# Patient Record
Sex: Male | Born: 1980 | Race: White | Hispanic: No | Marital: Married
Health system: Southern US, Community
[De-identification: ages and names within clinical notes are randomized; demographics above are authoritative.]

---

## 2009-01-15 ENCOUNTER — Ambulatory Visit: Payer: Self-pay | Admitting: Internal Medicine

## 2015-07-24 ENCOUNTER — Other Ambulatory Visit: Payer: Self-pay | Admitting: Unknown Physician Specialty

## 2015-07-24 ENCOUNTER — Ambulatory Visit
Admission: RE | Admit: 2015-07-24 | Discharge: 2015-07-24 | Disposition: A | Discharge status: Home / Self Care | Payer: Managed Care, Other (non HMO) | Source: Ambulatory Visit | Attending: Unknown Physician Specialty | Admitting: Unknown Physician Specialty

## 2015-07-24 DIAGNOSIS — R109 Unspecified abdominal pain: Secondary | ICD-10-CM

## 2015-07-24 DIAGNOSIS — R1033 Periumbilical pain: Secondary | ICD-10-CM | POA: Diagnosis present

## 2015-08-21 ENCOUNTER — Other Ambulatory Visit: Payer: Self-pay | Admitting: Family Medicine

## 2015-08-21 DIAGNOSIS — R2689 Other abnormalities of gait and mobility: Secondary | ICD-10-CM

## 2015-08-21 DIAGNOSIS — R413 Other amnesia: Secondary | ICD-10-CM

## 2015-08-26 ENCOUNTER — Ambulatory Visit
Admission: RE | Admit: 2015-08-26 | Discharge: 2015-08-26 | Disposition: A | Discharge status: Home / Self Care | Payer: Managed Care, Other (non HMO) | Source: Ambulatory Visit | Attending: Family Medicine | Admitting: Family Medicine

## 2015-11-25 ENCOUNTER — Other Ambulatory Visit: Payer: Self-pay | Admitting: Internal Medicine

## 2015-11-25 DIAGNOSIS — E291 Testicular hypofunction: Secondary | ICD-10-CM

## 2015-11-25 DIAGNOSIS — E230 Hypopituitarism: Secondary | ICD-10-CM

## 2015-11-28 ENCOUNTER — Ambulatory Visit
Admission: RE | Admit: 2015-11-28 | Discharge: 2015-11-28 | Disposition: A | Discharge status: Home / Self Care | Payer: Managed Care, Other (non HMO) | Source: Ambulatory Visit | Attending: Internal Medicine | Admitting: Internal Medicine

## 2015-11-28 DIAGNOSIS — E230 Hypopituitarism: Secondary | ICD-10-CM | POA: Insufficient documentation

## 2015-11-28 DIAGNOSIS — E291 Testicular hypofunction: Secondary | ICD-10-CM | POA: Insufficient documentation

## 2015-11-28 DIAGNOSIS — R9089 Other abnormal findings on diagnostic imaging of central nervous system: Secondary | ICD-10-CM | POA: Diagnosis not present

## 2015-11-28 MED ORDER — GADOBENATE DIMEGLUMINE 529 MG/ML IV SOLN
10.0000 mL | Freq: Once | INTRAVENOUS | Status: AC | PRN
  Administered 2015-11-28: 10 mL via INTRAVENOUS

## 2016-04-16 ENCOUNTER — Other Ambulatory Visit: Payer: Self-pay | Admitting: Internal Medicine

## 2016-04-16 DIAGNOSIS — D352 Benign neoplasm of pituitary gland: Secondary | ICD-10-CM

## 2016-04-28 ENCOUNTER — Ambulatory Visit: Payer: Managed Care, Other (non HMO)

## 2016-06-02 ENCOUNTER — Ambulatory Visit
Admission: RE | Admit: 2016-06-02 | Discharge: 2016-06-02 | Disposition: A | Discharge status: Home / Self Care | Payer: Managed Care, Other (non HMO) | Source: Ambulatory Visit | Attending: Internal Medicine | Admitting: Internal Medicine

## 2016-06-02 DIAGNOSIS — D352 Benign neoplasm of pituitary gland: Secondary | ICD-10-CM | POA: Diagnosis present

## 2016-06-02 DIAGNOSIS — J331 Polypoid sinus degeneration: Secondary | ICD-10-CM | POA: Diagnosis not present

## 2016-06-02 MED ORDER — GADOBENATE DIMEGLUMINE 529 MG/ML IV SOLN
10.0000 mL | Freq: Once | INTRAVENOUS | Status: AC | PRN
  Administered 2016-06-02: 10 mL via INTRAVENOUS

## 2016-09-21 DIAGNOSIS — D352 Benign neoplasm of pituitary gland: Secondary | ICD-10-CM

## 2016-09-21 HISTORY — DX: Benign neoplasm of pituitary gland: D35.2

## 2017-08-03 IMAGING — MR MR HEAD WO/W CM
10 of 18 series · 35 of 48 positions shown · IV contrast (multihance)
Comparison: 11/28/2015 brain MR.

CLINICAL DATA: 35-year-old male with history of hypogonadism.
Patient seeing spots in right eye over the past week. Subsequent
encounter.

EXAM:
MRI HEAD WITHOUT AND WITH CONTRAST
TECHNIQUE: Multiplanar, multiecho pulse sequences of the brain and surrounding
structures were obtained without and with intravenous contrast.
CONTRAST:  10mL MULTIHANCE GADOBENATE DIMEGLUMINE 529 MG/ML IV SOLN

[Series 2: T1 · sagittal · 5.0mm · 0.62mm/px · 1 of 31 slices shown]
[im 1/31]
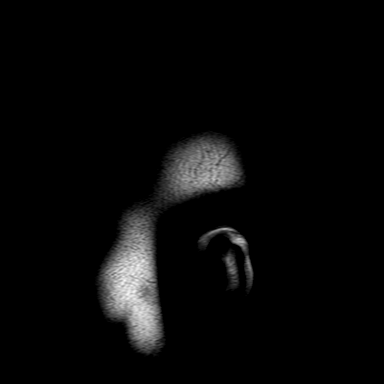

[Series 4: DWI · axial · 4.0mm · 0.94mm/px · z∈[-72,+102]mm · 5 of 45 slices shown (1 of 2)]
[im 1/45]
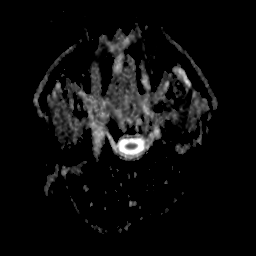
[im 12/45]
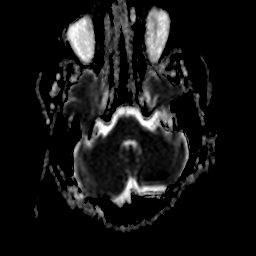
[im 23/45]
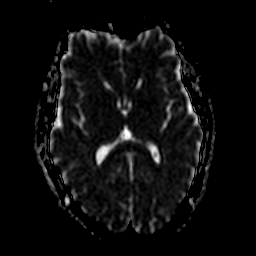
[im 34/45]
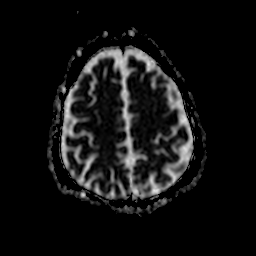
[im 45/45]
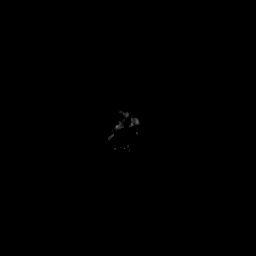

[Series 5: T2 · axial · 5.0mm · 0.57mm/px · z∈[-78,+102]mm · 3 of 27 slices shown (1 of 2)]
[im 1/27]
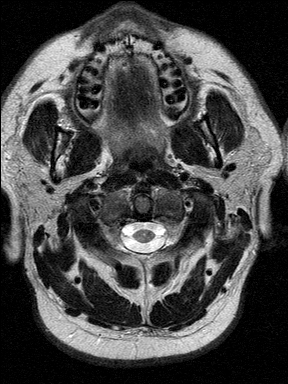
[im 14/27]
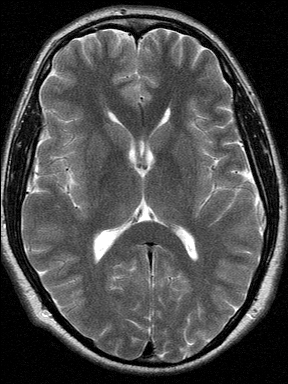
[im 27/27]
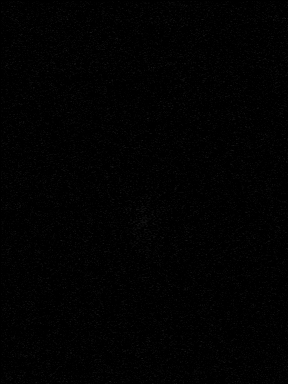

[Series 6: T2 · axial · 5.0mm · 0.57mm/px · z∈[-76,+104]mm · 3 of 27 slices shown (2 of 2)]
[im 1/27]
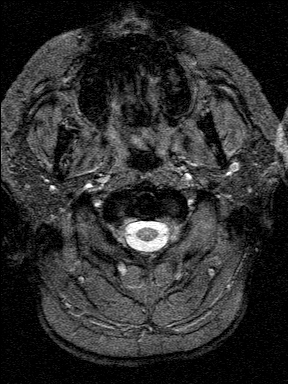
[im 14/27]
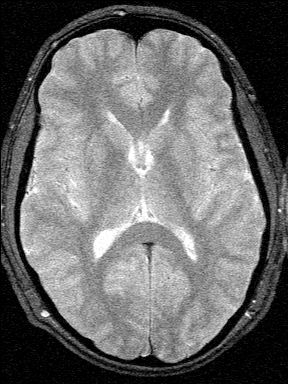
[im 27/27]
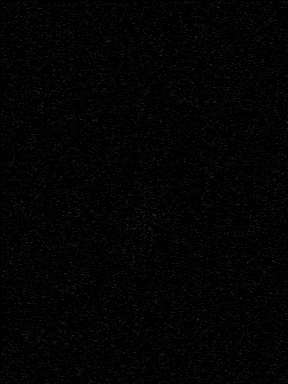

[Series 7: DWI · axial · 4.0mm · 0.94mm/px · z∈[-72,+98]mm · 5 of 44 slices shown (2 of 2)]
[im 1/44]
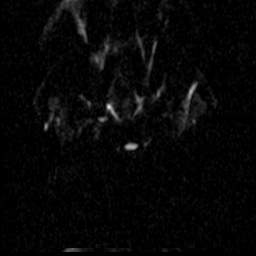
[im 11/44]
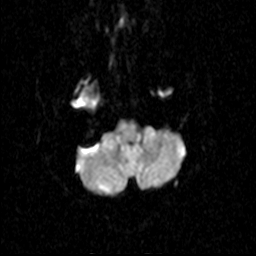
[im 22/44]
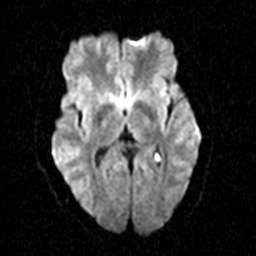
[im 33/44]
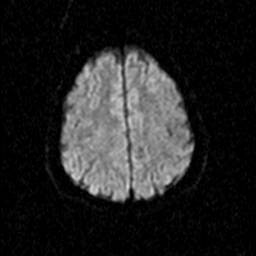
[im 44/44]
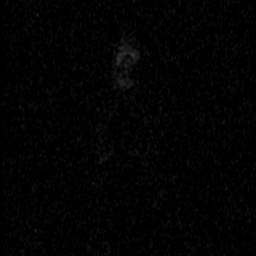

[Series 8: FLAIR · axial · 5.0mm · 0.57mm/px · z∈[-71,+94]mm · 3 of 25 slices shown]
[im 1/25]
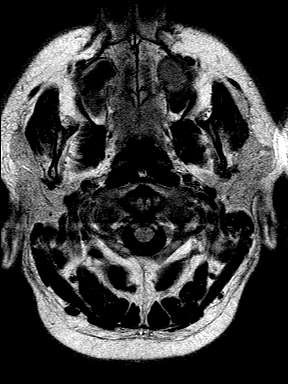
[im 13/25]
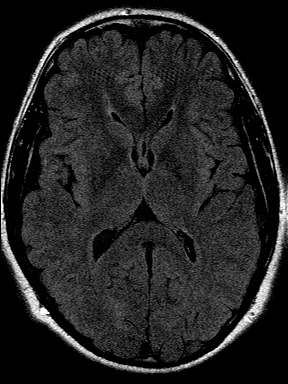
[im 25/25]
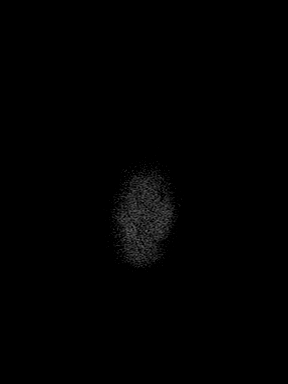

[Series 22: T1 post-contrast · sagittal · 3.0mm · 0.42mm/px · 2 of 15 slices shown (1 of 4)]
[im 1/15]
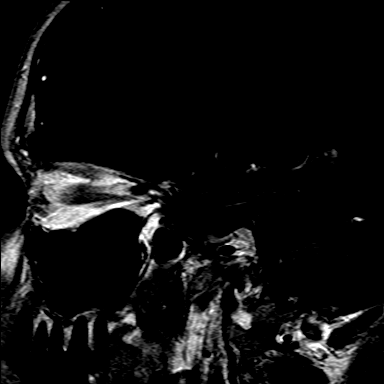
[im 15/15]
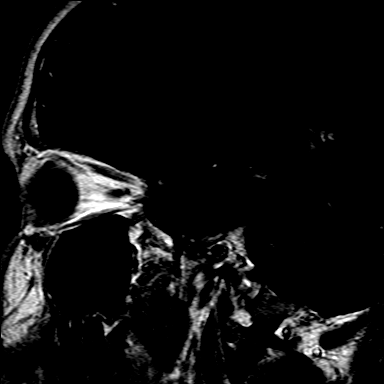

[Series 23: T1 post-contrast · coronal · 3.0mm · 0.42mm/px · 2 of 15 slices shown (2 of 4)]
[im 1/15]
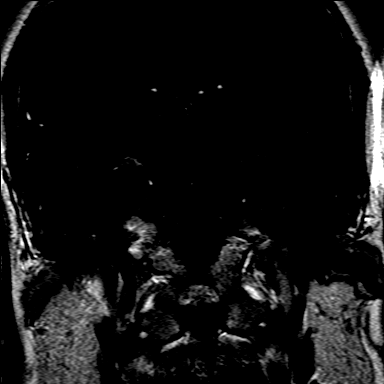
[im 15/15]
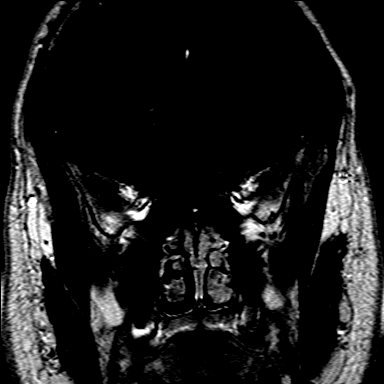

[Series 24: T1 post-contrast · axial · 3.0mm · 0.43mm/px · z∈[-72,+103]mm · 7 of 60 slices shown (3 of 4)]
[im 1/60]
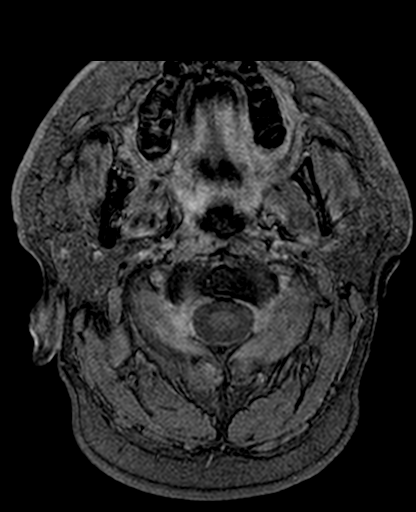
[im 10/60]
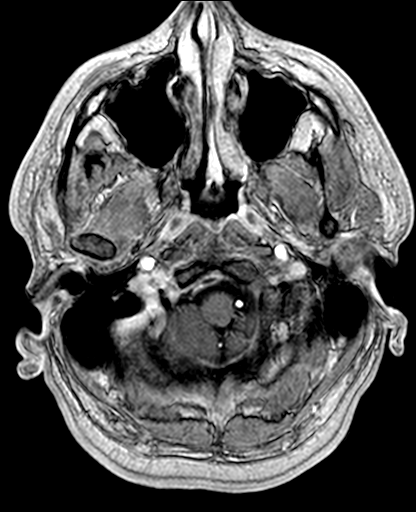
[im 20/60]
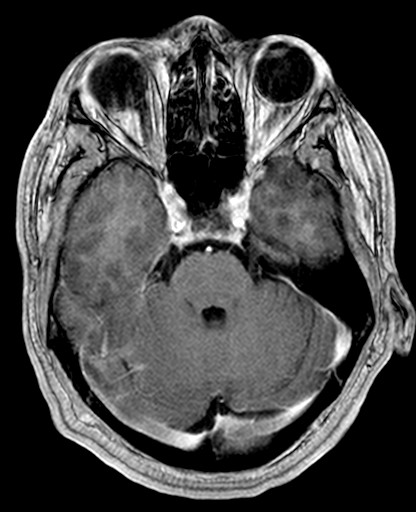
[im 30/60]
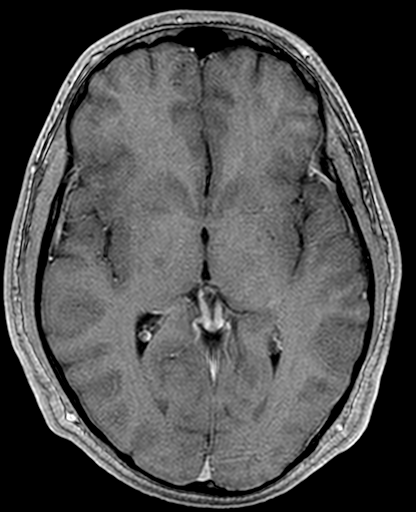
[im 40/60]
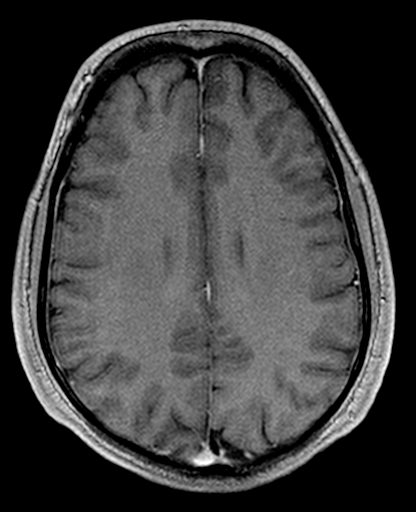
[im 50/60]
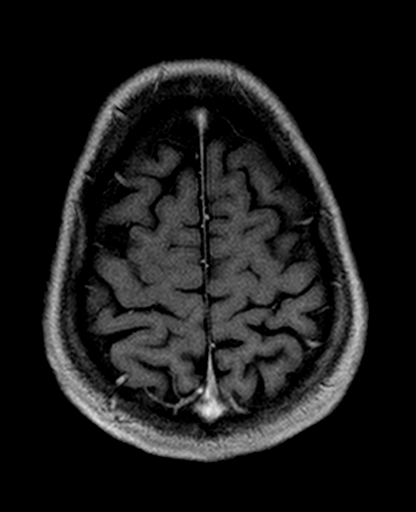
[im 60/60]
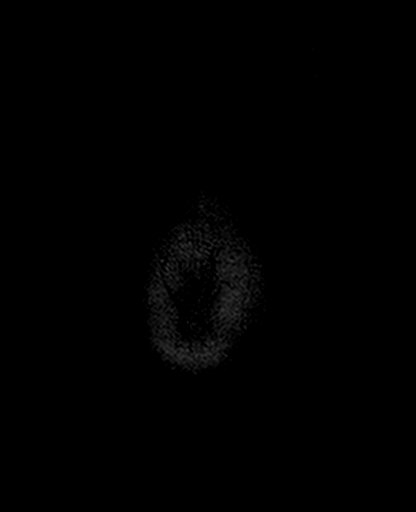

[Series 25: T1 post-contrast · coronal · 5.0mm · 0.43mm/px · 4 of 32 slices shown (4 of 4)]
[im 1/32]
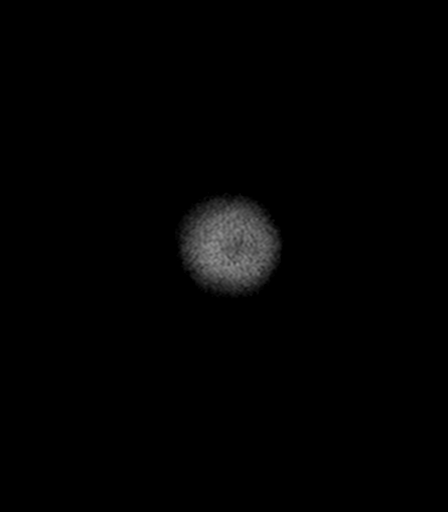
[im 11/32]
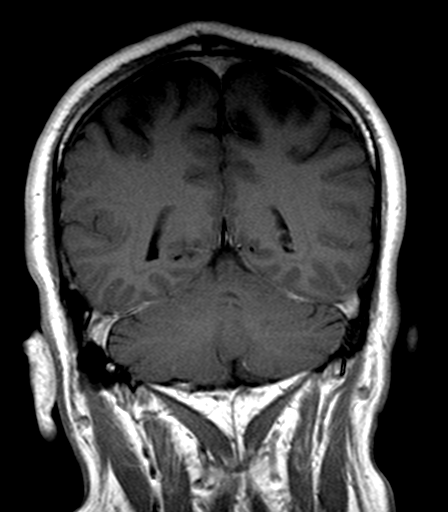
[im 21/32]
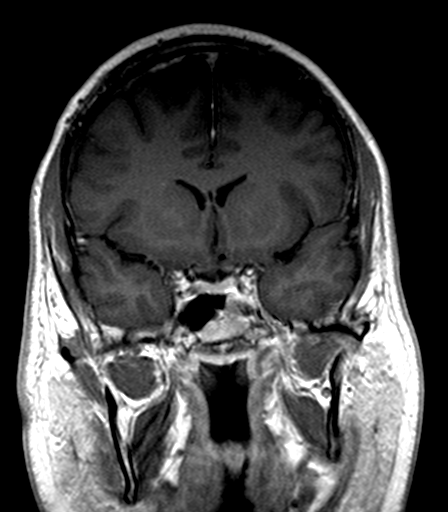
[im 32/32]
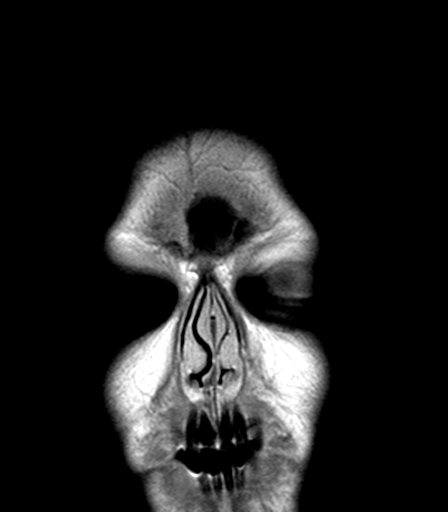

[35 of 48 positions shown; findings below may reference images not displayed]

FINDINGS: 3.8 x 1.3 mm area of decreased enhancement within the pituitary
gland is unchanged. This is located at the expected junction of the
anterior and posterior gland and may represent a small incidentally
noted pars intermedius cyst rather than a Rathke's cleft cyst or
microadenoma.

No acute infarct or intracranial hemorrhage.

No age advanced atrophy or hydrocephalus.

Major intracranial vascular structures are patent.

No acute orbital abnormality.

1.1 cm polypoid opacification inferior left maxillary sinus. Minimal
mucosal thickening maxillary sinuses and ethmoid sinus air cells
bilaterally.
IMPRESSION: 3.8 x 1.3 mm area of decreased enhancement within the pituitary
gland is unchanged. This is located at the expected junction of the
anterior and posterior gland and may represent a small incidentally
noted pars intermedius cyst rather than a Rathke's cleft cyst or
microadenoma.

1.1 cm polypoid opacification inferior left maxillary sinus. Minimal
mucosal thickening maxillary sinuses and ethmoid sinus air cells
bilaterally.

## 2019-05-24 ENCOUNTER — Other Ambulatory Visit: Payer: Self-pay

## 2019-05-24 DIAGNOSIS — Z20822 Contact with and (suspected) exposure to covid-19: Secondary | ICD-10-CM

## 2019-05-25 LAB — NOVEL CORONAVIRUS, NAA: SARS-CoV-2, NAA: NOT DETECTED

## 2020-02-14 ENCOUNTER — Ambulatory Visit (INDEPENDENT_AMBULATORY_CARE_PROVIDER_SITE_OTHER): Payer: BC Managed Care – PPO | Admitting: Dermatology

## 2020-02-14 ENCOUNTER — Other Ambulatory Visit: Payer: Self-pay

## 2020-02-14 ENCOUNTER — Encounter: Payer: Self-pay | Admitting: Dermatology

## 2020-02-14 DIAGNOSIS — R234 Changes in skin texture: Secondary | ICD-10-CM | POA: Diagnosis not present

## 2020-02-14 DIAGNOSIS — L2081 Atopic neurodermatitis: Secondary | ICD-10-CM | POA: Diagnosis not present

## 2020-02-14 DIAGNOSIS — L2089 Other atopic dermatitis: Secondary | ICD-10-CM | POA: Diagnosis not present

## 2020-02-14 DIAGNOSIS — L011 Impetiginization of other dermatoses: Secondary | ICD-10-CM | POA: Diagnosis not present

## 2020-02-14 MED ORDER — TRIAMCINOLONE ACETONIDE 0.1 % EX CREA: 1.0000 "application " | TOPICAL_CREAM | Freq: Two times a day (BID) | CUTANEOUS | 1 refills | Status: AC | PRN

## 2020-02-14 MED ORDER — DOXYCYCLINE HYCLATE 100 MG PO CAPS: 100.0000 mg | ORAL_CAPSULE | Freq: Two times a day (BID) | ORAL | 0 refills | Status: AC

## 2020-02-14 NOTE — Patient Instructions (Signed)
Call office in 3-4 weeks to let Dr. Nehemiah Massed how you are doing. If not improving, will plan to send in Eucrisa at that point instead of waiting until follow up appointment.

## 2020-02-14 NOTE — Progress Notes (Signed)
   New Patient Visit  Subjective  Michael Griffith is a 39 y.o. male who presents for the following: Rash (of bilateral arms. Has dry skin and gets open sores. This has been going on since he was a teenager. He has seen a dermatologist in the past. It was on his legs when he was younger.). The condition does itch and he scratches a lot.   The following portions of the chart were reviewed this encounter and updated as appropriate:  Allergies  Meds  Problems  Med Hx  Surg Hx  Fam Hx      Review of Systems:  No other skin or systemic complaints except as noted in HPI or Assessment and Plan.  Objective  Well appearing patient in no apparent distress; mood and affect are within normal limits.  All skin waist up examined.  Objective  Right Antecubital Fossa: Pinkness, scale, crusts, erosions of arm and shoulders.  Pink macular eruption of abdomen.  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  May consider Dupixent in the future.  doxycycline (VIBRAMYCIN) 100 MG capsule - Right Antecubital Fossa  triamcinolone cream (KENALOG) 0.1 % - Right Antecubital Fossa  Return in about 8 weeks (around 04/10/2020).  I, Ashok Cordia, CMA, am acting as scribe for Sarina Ser, MD . Documentation: I have reviewed the above documentation for accuracy and completeness, and I agree with the above.  Sarina Ser, MD

## 2020-02-16 ENCOUNTER — Encounter: Payer: Self-pay | Admitting: Dermatology

## 2020-04-11 ENCOUNTER — Ambulatory Visit: Payer: BC Managed Care – PPO | Admitting: Dermatology

## 2020-06-27 ENCOUNTER — Ambulatory Visit (INDEPENDENT_AMBULATORY_CARE_PROVIDER_SITE_OTHER): Payer: BC Managed Care – PPO | Admitting: Dermatology

## 2020-06-27 ENCOUNTER — Other Ambulatory Visit: Payer: Self-pay

## 2020-06-27 DIAGNOSIS — L011 Impetiginization of other dermatoses: Secondary | ICD-10-CM | POA: Diagnosis not present

## 2020-06-27 DIAGNOSIS — L2081 Atopic neurodermatitis: Secondary | ICD-10-CM

## 2020-06-27 DIAGNOSIS — L209 Atopic dermatitis, unspecified: Secondary | ICD-10-CM

## 2020-06-27 MED ORDER — DOXYCYCLINE MONOHYDRATE 100 MG PO CAPS: 100.0000 mg | ORAL_CAPSULE | Freq: Two times a day (BID) | ORAL | 0 refills | Status: AC

## 2020-06-27 MED ORDER — DUPIXENT 300 MG/2ML ~~LOC~~ SOSY: 600.0000 mg | PREFILLED_SYRINGE | Freq: Once | SUBCUTANEOUS | 0 refills | Status: AC

## 2020-06-27 MED ORDER — MUPIROCIN 2 % EX OINT: 1.0000 "application " | TOPICAL_OINTMENT | Freq: Two times a day (BID) | CUTANEOUS | 1 refills | Status: AC

## 2020-06-27 MED ORDER — DUPIXENT 300 MG/2ML ~~LOC~~ SOSY: 300.0000 mg | PREFILLED_SYRINGE | SUBCUTANEOUS | 6 refills | Status: AC

## 2020-06-27 NOTE — Progress Notes (Signed)
   Follow-Up Visit   Subjective  Michael Griffith is a 39 y.o. male who presents for the following: Atopic Derm -severe with atopic neurodermatitis (currently bil arms, > 8wk f/u, TMC 0.1% cr bid, pt said improved on Doxycycline x 1wk, but recently flared and itchy).  The following portions of the chart were reviewed this encounter and updated as appropriate:  Allergies  Meds  Problems  Med Hx  Surg Hx  Fam Hx     Review of Systems:  No other skin or systemic complaints except as noted in HPI or Assessment and Plan.  Objective  Well appearing patient in no apparent distress; mood and affect are within normal limits.  A focused examination was performed including bil arms. Relevant physical exam findings are noted in the Assessment and Plan.  Objective  bil arms: Pinkness, scale, crusts, erosions of arm and shoulders.   Assessment & Plan  Atopic dermatitis; atopic neurodermatitis-severe with crusts and excoriations and secondary impetiginization -worse today despite treatment.  BSA 12% bil arms Severe with atopic neurodermatitis With crusts and excoriations and impetiginization  Restart Doxycycline 100mg  1 po bid with food and drink for 2wks Start Mupirocin oint bid to open sores Cont TMC 0.1% cr qd up to 5d/wk to arms prn itch, avoid f/g/a  Discussed Dupixent injections, will plan to start on f/u.  Gave informational literature on Dupixent BSA 12%  Doxycycline should be taken with food to prevent nausea. Do not lay down for 30 minutes after taking. Be cautious with sun exposure and use good sun protection while on this medication. Pregnant women should not take this medication.    Dupilumab (Dupixent) is a treatment given by injection for adults with moderate-to-severe atopic dermatitis. Goal is control of skin condition, not cure. It is given as 2 injections at the first dose followed by 1 injection ever 2 weeks thereafter.  Potential side effects include allergic reaction,  herpes infections, injection site reactions and conjunctivitis (inflammation of the eyes).  The use of Dupixent requires long term medication management, including periodic office visits.   doxycycline (MONODOX) 100 MG capsule - bil arms  mupirocin ointment (BACTROBAN) 2 % - bil arms  dupilumab (DUPIXENT) 300 MG/2ML prefilled syringe - bil arms  dupilumab (DUPIXENT) 300 MG/2ML prefilled syringe - bil arms  Return in about 2 years (around 06/27/2022) for Atopic derm/start Dupixent.   I, Othelia Pulling, RMA, am acting as scribe for Sarina Ser, MD .  Documentation: I have reviewed the above documentation for accuracy and completeness, and I agree with the above.  Sarina Ser, MD

## 2020-06-28 ENCOUNTER — Encounter: Payer: Self-pay | Admitting: Dermatology

## 2020-07-11 ENCOUNTER — Other Ambulatory Visit: Payer: Self-pay

## 2020-07-11 ENCOUNTER — Ambulatory Visit (INDEPENDENT_AMBULATORY_CARE_PROVIDER_SITE_OTHER): Payer: BC Managed Care – PPO | Admitting: Dermatology

## 2020-07-11 DIAGNOSIS — L2081 Atopic neurodermatitis: Secondary | ICD-10-CM | POA: Diagnosis not present

## 2020-07-11 MED ORDER — TACROLIMUS 0.1 % EX OINT: TOPICAL_OINTMENT | Freq: Every day | CUTANEOUS | 1 refills | Status: AC

## 2020-07-11 MED ORDER — DUPILUMAB 300 MG/2ML ~~LOC~~ SOSY
300.0000 mg | PREFILLED_SYRINGE | Freq: Once | SUBCUTANEOUS | Status: AC
  Administered 2020-07-11: 300 mg via SUBCUTANEOUS

## 2020-07-11 MED ORDER — DUPILUMAB 300 MG/2ML ~~LOC~~ SOSY
300.0000 mg | PREFILLED_SYRINGE | Freq: Once | SUBCUTANEOUS | Status: AC
Start: 2020-07-11 — End: 2020-07-11
  Administered 2020-07-11: 300 mg via SUBCUTANEOUS

## 2020-07-11 MED ORDER — MOMETASONE FUROATE 0.1 % EX CREA: 1.0000 "application " | TOPICAL_CREAM | Freq: Every day | CUTANEOUS | 1 refills | Status: AC

## 2020-07-11 NOTE — Progress Notes (Signed)
   Follow-Up Visit   Subjective  Michael Griffith is a 39 y.o. male who presents for the following: Dermatitis (Atopic Derm severe, bil arms f/u, finished Doxycycline x 2wks, using mupirocin oint bid, TMC 0.1% cr qd, has had some mild itching which didnt have before).  The following portions of the chart were reviewed this encounter and updated as appropriate:  Allergies  Meds  Problems  Med Hx  Surg Hx  Fam Hx     Review of Systems:  No other skin or systemic complaints except as noted in HPI or Assessment and Plan.  Objective  Well appearing patient in no apparent distress; mood and affect are within normal limits.  A focused examination was performed including bil arms. Relevant physical exam findings are noted in the Assessment and Plan.  Objective  bil arms: Pinkness, scale, crusts, erosions of arm and shoulders  Images       Assessment & Plan  Atopic neurodermatitis bil arms Atopic dermatitis; atopic neurodermatitis-severe with crusts and excoriations and secondary impetiginization. Failed treatment with topical steroids.  Impetiginization much improved/resolving with recent Doxycycline 2wk course  BSA 12%  Start Dupixent 300mg /58ml sq injections  Today Dupixent 300mg /1ml sq injections to R and L upper arm Lot #[id number] exp 09/2021 (Dupixent loading dose 2 injections)  D/c TMC 0.1% cr Start Mometasone cr qd aa arms Start Tacrolimus 0.1% oint qd to aa arms Cont Mupirocin oint qd to any open sores  Dupilumab (Dupixent) is a treatment given by injection for adults with moderate-to-severe atopic dermatitis. Goal is control of skin condition, not cure. It is given as 2 injections at the first dose followed by 1 injection ever 2 weeks thereafter.  Potential side effects include allergic reaction, herpes infections, injection site reactions and conjunctivitis (inflammation of the eyes).  The use of Dupixent requires long term medication management, including periodic  office visits.   mometasone (ELOCON) 0.1 % cream - bil arms  tacrolimus (PROTOPIC) 0.1 % ointment - bil arms  dupilumab (DUPIXENT) prefilled syringe 300 mg - bil arms  dupilumab (DUPIXENT) prefilled syringe 300 mg - bil arms  Return in about 2 weeks (around 07/25/2020) for Atopic derm/Dupixent.   I, Othelia Pulling, RMA, am acting as scribe for Sarina Ser, MD .  Documentation: I have reviewed the above documentation for accuracy and completeness, and I agree with the above.  Sarina Ser, MD

## 2020-07-11 NOTE — Patient Instructions (Signed)
Tacrolimus ointment 0.1% apply to arms once daily Mometasone cream apply to arms once daily  Stop the Triamcinolone cream

## 2020-07-16 ENCOUNTER — Encounter: Payer: Self-pay | Admitting: Dermatology

## 2020-07-25 ENCOUNTER — Other Ambulatory Visit: Payer: Self-pay

## 2020-07-25 ENCOUNTER — Ambulatory Visit (INDEPENDENT_AMBULATORY_CARE_PROVIDER_SITE_OTHER): Payer: BC Managed Care – PPO | Admitting: Dermatology

## 2020-07-25 DIAGNOSIS — Z79899 Other long term (current) drug therapy: Secondary | ICD-10-CM | POA: Diagnosis not present

## 2020-07-25 DIAGNOSIS — L2081 Atopic neurodermatitis: Secondary | ICD-10-CM | POA: Diagnosis not present

## 2020-07-25 MED ORDER — DUPILUMAB 300 MG/2ML ~~LOC~~ SOSY
300.0000 mg | PREFILLED_SYRINGE | Freq: Once | SUBCUTANEOUS | Status: AC
Start: 2020-07-25 — End: 2020-07-25
  Administered 2020-07-25: 300 mg via SUBCUTANEOUS

## 2020-07-25 NOTE — Progress Notes (Signed)
   Follow-Up Visit   Subjective  Michael Griffith is a 39 y.o. male who presents for the following: Follow-up (Atopic dermatitis - 2 weeks. Started with loading dose of Dupixent 2 weeks ago. Seems like complexion is clearing up a little bit and he is itching less.).  The following portions of the chart were reviewed this encounter and updated as appropriate:  Allergies  Meds  Problems  Med Hx  Surg Hx  Fam Hx     Review of Systems:  No other skin or systemic complaints except as noted in HPI or Assessment and Plan.  Objective  Well appearing patient in no apparent distress; mood and affect are within normal limits.  A focused examination was performed including arms. Relevant physical exam findings are noted in the Assessment and Plan.  Objective  Arms: Pinkness, scale, crusts, erosions of arm and shoulders   Assessment & Plan  Atopic neurodermatitis - chronic; persisent with less itch since Dupixent started last visit 2 weeks ago Arms Atopic dermatitis; atopic neurodermatitis-severe with crusts and excoriations and secondary impetiginization. Failed treatment with topical steroids.   Atopic dermatitis - Severe, on Dupixent (biologic medication).  Atopic dermatitis (eczema) is a chronic, relapsing, pruritic condition that can significantly affect quality of life. It is often associated with allergic rhinitis and/or asthma and can require treatment with topical medications, phototherapy, or in severe cases a biologic medication called Dupixent.   Today Dupixent 300mg /33ml sq injections to left upper arm Lot #[id number] exp 09/2021. Patient tolerated well.  Continue Mometasone cr qd aa arms Continue Tacrolimus 0.1% oint qd to aa arms Cont Mupirocin oint qd to any open sores   Dupilumab (Dupixent) is a treatment given by injection for adults with moderate-to-severe atopic dermatitis. Goal is control of skin condition, not cure. It is given as 2 injections at the first dose followed by  1 injection ever 2 weeks thereafter.   Potential side effects include allergic reaction, herpes infections, injection site reactions and conjunctivitis (inflammation of the eyes).  The use of Dupixent requires long term medication management, including periodic office visits.   mometasone (ELOCON) 0.1 % cream - Arms  tacrolimus (PROTOPIC) 0.1 % ointment - Arms  dupilumab (DUPIXENT) prefilled syringe 300 mg - Arms  Return in about 2 weeks (around 08/08/2020).   I, Ashok Cordia, CMA, am acting as scribe for Sarina Ser, MD .  Documentation: I have reviewed the above documentation for accuracy and completeness, and I agree with the above.  Sarina Ser, MD

## 2020-07-29 ENCOUNTER — Encounter: Payer: Self-pay | Admitting: Dermatology

## 2020-08-08 ENCOUNTER — Ambulatory Visit (INDEPENDENT_AMBULATORY_CARE_PROVIDER_SITE_OTHER): Payer: BC Managed Care – PPO | Admitting: Dermatology

## 2020-08-08 ENCOUNTER — Other Ambulatory Visit: Payer: Self-pay

## 2020-08-08 DIAGNOSIS — L2081 Atopic neurodermatitis: Secondary | ICD-10-CM

## 2020-08-08 NOTE — Progress Notes (Signed)
   Follow-Up Visit   Subjective  Michael Griffith is a 39 y.o. male who presents for the following: Follow-up (2 weeks f/u atopic dermatitis, treating with Dupixent injection today will be the 4th injection, pt report complexion seems to be clearing up ).  The following portions of the chart were reviewed this encounter and updated as appropriate:  Allergies  Meds  Problems  Med Hx  Surg Hx  Fam Hx     Review of Systems:  No other skin or systemic complaints except as noted in HPI or Assessment and Plan.  Objective  Well appearing patient in no apparent distress; mood and affect are within normal limits.  A focused examination was performed including exts, trunk . Relevant physical exam findings are noted in the Assessment and Plan.  Objective  trunk, exts: Scaly erythematous papules and patches +/- dyspigmentation, lichenification, excoriations.    Assessment & Plan  Atopic neurodermatitis trunk, exts  Chronic, not currently at goal.    Atopic dermatitis (eczema) is a chronic, relapsing, pruritic condition that can significantly affect quality of life. It is often associated with allergic rhinitis and/or asthma and can require treatment with topical medications, phototherapy, or in severe cases a biologic medication called Dupixent.    Today Dupixent 300mg /22ml sq injections to Right upper arm Lot #[id number] exp 09/2021. Patient tolerated well.   Continue Mometasone cr qd aa arms Continue Tacrolimus 0.1% oint qd to aa arms Cont Mupirocin oint qd to any open sores   Dupilumab (Dupixent) is a treatment given by injection for adults with moderate-to-severe atopic dermatitis. Goal is control of skin condition, not cure. It is given as 2 injections at the first dose followed by 1 injection ever 2 weeks thereafter.   Potential side effects include allergic reaction, herpes infections, injection site reactions and conjunctivitis (inflammation of the eyes).  The use of Dupixent requires  long term medication management, including periodic office visits.       Other Related Medications mometasone (ELOCON) 0.1 % cream tacrolimus (PROTOPIC) 0.1 % ointment  Return in about 2 weeks (around 08/22/2020).  IMarye Round, CMA, am acting as scribe for Sarina Ser, MD .  Documentation: I have reviewed the above documentation for accuracy and completeness, and I agree with the above.  Sarina Ser, MD

## 2020-08-20 ENCOUNTER — Encounter: Payer: Self-pay | Admitting: Dermatology

## 2020-08-22 ENCOUNTER — Other Ambulatory Visit: Payer: Self-pay

## 2020-08-22 ENCOUNTER — Ambulatory Visit (INDEPENDENT_AMBULATORY_CARE_PROVIDER_SITE_OTHER): Payer: BC Managed Care – PPO | Admitting: Dermatology

## 2020-08-22 DIAGNOSIS — L2081 Atopic neurodermatitis: Secondary | ICD-10-CM

## 2020-08-22 MED ORDER — DUPILUMAB 300 MG/2ML ~~LOC~~ SOAJ
300.0000 mg | Freq: Once | SUBCUTANEOUS | Status: AC
  Administered 2020-08-22: 300 mg via SUBCUTANEOUS

## 2020-08-22 NOTE — Progress Notes (Signed)
   Follow-Up Visit   Subjective  Michael Griffith is a 39 y.o. male who presents for the following: Follow-up (2 weeks f/u Atopic dermatitis, treating with Dupixent injection, today will be patients 5th Dupixent injection, pt report skin clearing up ).  The following portions of the chart were reviewed this encounter and updated as appropriate:  Allergies  Meds  Problems  Med Hx  Surg Hx  Fam Hx     Review of Systems:  No other skin or systemic complaints except as noted in HPI or Assessment and Plan.  Objective  Well appearing patient in no apparent distress; mood and affect are within normal limits.  A focused examination was performed including face,exts,trunk. Relevant physical exam findings are noted in the Assessment and Plan.  Objective  face, trunk, exts: Scaly erythematous papules and patches +/- dyspigmentation, lichenification, excoriations.      Assessment & Plan  Atopic neurodermatitis face, trunk, exts Chronic condition, no cure, only control. Not currently at goal.   Atopic dermatitis (eczema) is a chronic, relapsing, pruritic condition that can significantly affect quality of life. It is often associated with allergic rhinitis and/or asthma and can require treatment with topical medications, phototherapy, or in severe cases a biologic medication called Dupixent.    Today Dupixent 300mg /75ml sq injections to Left upper arm Lot #[id number] exp 09/2021. Patient tolerated well.   Continue Mometasone cr qd aa arms Continue Tacrolimus 0.1% oint qd to aa arms Cont Mupirocin oint qd to any open sores    Dupilumab (Dupixent) is a treatment given by injection for adults with moderate-to-severe atopic dermatitis. Goal is control of skin condition, not cure. It is given as 2 injections at the first dose followed by 1 injection ever 2 weeks thereafter.  Potential side effects include allergic reaction, herpes infections, injection site reactions and conjunctivitis (inflammation  of the eyes).  The use of Dupixent requires long term medication management, including periodic office visits.   Other Related Medications mometasone (ELOCON) 0.1 % cream tacrolimus (PROTOPIC) 0.1 % ointment  Return in about 1 week (around 08/29/2020) for Dupixent injection .  IMarye Round, CMA, am acting as scribe for Sarina Ser, MD .  Documentation: I have reviewed the above documentation for accuracy and completeness, and I agree with the above.  Sarina Ser, MD

## 2020-08-28 ENCOUNTER — Encounter: Payer: Self-pay | Admitting: Dermatology

## 2020-09-05 ENCOUNTER — Other Ambulatory Visit: Payer: Self-pay

## 2020-09-05 ENCOUNTER — Ambulatory Visit (INDEPENDENT_AMBULATORY_CARE_PROVIDER_SITE_OTHER): Payer: BC Managed Care – PPO | Admitting: Dermatology

## 2020-09-05 DIAGNOSIS — L2081 Atopic neurodermatitis: Secondary | ICD-10-CM | POA: Diagnosis not present

## 2020-09-05 MED ORDER — DUPILUMAB 300 MG/2ML ~~LOC~~ SOSY
300.0000 mg | PREFILLED_SYRINGE | Freq: Once | SUBCUTANEOUS | Status: AC
  Administered 2020-09-05: 300 mg via SUBCUTANEOUS

## 2020-09-05 MED ORDER — EUCRISA 2 % EX OINT: 1.0000 "application " | TOPICAL_OINTMENT | CUTANEOUS | 4 refills | Status: AC

## 2020-09-05 NOTE — Progress Notes (Signed)
   Follow-Up Visit   Subjective  Michael Griffith is a 39 y.o. male who presents for the following: Dermatitis (Bil arms, 2wk f/u Dupixent, not using any topicals).  The following portions of the chart were reviewed this encounter and updated as appropriate:   Allergies  Meds  Problems  Med Hx  Surg Hx  Fam Hx     Review of Systems:  No other skin or systemic complaints except as noted in HPI or Assessment and Plan.  Objective  Well appearing patient in no apparent distress; mood and affect are within normal limits.  A focused examination was performed including bil arms. Relevant physical exam findings are noted in the Assessment and Plan.  Objective  bil arms: Scaly erythematous papules and patches +/- dyspigmentation, lichenification, excoriations   Assessment & Plan  Atopic neurodermatitis bil arms  Chronic condition, no cure, only control. Not currently at goal.  Currently just on bil arms, hx of Atopic derm face, trunk, extremities Improving  Atopic dermatitis - Severe, on Dupixent (biologic medication).  Atopic dermatitis (eczema) is a chronic, relapsing, pruritic condition that can significantly affect quality of life. It is often associated with allergic rhinitis and/or asthma and can require treatment with topical medications, phototherapy, or in severe cases a biologic medication called Dupixent.    Start Eucrisa  oint qd to bid to aa arms Cont Dupixent 300mg /78ml sq injections q 2 wks  May consider Opzelura in future.  Dupixent 300mg /1ml sq injection to R upper arm today Lot [id number] exp 09/2022  Pt advised to call [facility] to get screened for patient assistance, phone number given.  Crisaborole (EUCRISA) 2 % OINT - bil arms  dupilumab (DUPIXENT) prefilled syringe 300 mg - bil arms  Other Related Medications mometasone (ELOCON) 0.1 % cream tacrolimus (PROTOPIC) 0.1 % ointment  Return in about 2 weeks (around 09/19/2020) for Atopic Derm.   I,  Othelia Pulling, RMA, am acting as scribe for Sarina Ser, MD .  Documentation: I have reviewed the above documentation for accuracy and completeness, and I agree with the above.  Sarina Ser, MD

## 2020-09-11 ENCOUNTER — Encounter: Payer: Self-pay | Admitting: Dermatology

## 2020-09-19 ENCOUNTER — Ambulatory Visit (INDEPENDENT_AMBULATORY_CARE_PROVIDER_SITE_OTHER): Payer: BC Managed Care – PPO

## 2020-09-19 ENCOUNTER — Other Ambulatory Visit: Payer: Self-pay

## 2020-09-19 DIAGNOSIS — L209 Atopic dermatitis, unspecified: Secondary | ICD-10-CM | POA: Diagnosis not present

## 2020-09-19 MED ORDER — DUPILUMAB 300 MG/2ML ~~LOC~~ SOSY
300.0000 mg | PREFILLED_SYRINGE | Freq: Once | SUBCUTANEOUS | Status: AC
  Administered 2020-09-19: 300 mg via SUBCUTANEOUS

## 2020-09-19 NOTE — Progress Notes (Signed)
Patient here for two week Dupixent injection.   Dupixent 300mg /17mL injected into left upper arm. Patient tolerated well.  LOT: [id number] EXP: 09/21/2022

## 2020-10-03 ENCOUNTER — Ambulatory Visit (INDEPENDENT_AMBULATORY_CARE_PROVIDER_SITE_OTHER): Payer: BC Managed Care – PPO

## 2020-10-03 ENCOUNTER — Other Ambulatory Visit: Payer: Self-pay

## 2020-10-03 DIAGNOSIS — L209 Atopic dermatitis, unspecified: Secondary | ICD-10-CM

## 2020-10-03 MED ORDER — DUPILUMAB 300 MG/2ML ~~LOC~~ SOSY
300.0000 mg | PREFILLED_SYRINGE | Freq: Once | SUBCUTANEOUS | Status: AC
  Administered 2020-10-03: 300 mg via SUBCUTANEOUS

## 2020-10-03 NOTE — Progress Notes (Signed)
Patient here for two week Dupixent injection.   Dupixent 300mg/2mL injected SQ into right arm. Patient tolerated well.   LOT: [id number] EXP: 09/2022  

## 2020-10-17 ENCOUNTER — Ambulatory Visit (INDEPENDENT_AMBULATORY_CARE_PROVIDER_SITE_OTHER): Payer: BC Managed Care – PPO

## 2020-10-17 ENCOUNTER — Other Ambulatory Visit: Payer: Self-pay

## 2020-10-17 DIAGNOSIS — L209 Atopic dermatitis, unspecified: Secondary | ICD-10-CM

## 2020-10-17 MED ORDER — DUPILUMAB 300 MG/2ML ~~LOC~~ SOSY
300.0000 mg | PREFILLED_SYRINGE | Freq: Once | SUBCUTANEOUS | Status: AC
  Administered 2020-10-17: 300 mg via SUBCUTANEOUS

## 2020-10-17 NOTE — Progress Notes (Signed)
Patient here for two week Dupixent injection.  Dupixent 300mg /30mL injected into patient's left arm. Patient tolerated well.  Patient states [facility] never received his enrollment form. Re submitting information today.  LOT: [id number] EXP: 09/21/2022

## 2020-10-31 ENCOUNTER — Other Ambulatory Visit: Payer: Self-pay

## 2020-10-31 ENCOUNTER — Ambulatory Visit (INDEPENDENT_AMBULATORY_CARE_PROVIDER_SITE_OTHER): Payer: BC Managed Care – PPO | Admitting: Dermatology

## 2020-10-31 DIAGNOSIS — L2081 Atopic neurodermatitis: Secondary | ICD-10-CM | POA: Diagnosis not present

## 2020-10-31 MED ORDER — DUPILUMAB 300 MG/2ML ~~LOC~~ SOSY
300.0000 mg | PREFILLED_SYRINGE | Freq: Once | SUBCUTANEOUS | Status: AC
  Administered 2020-10-31: 300 mg via SUBCUTANEOUS

## 2020-10-31 NOTE — Progress Notes (Signed)
   Follow-Up Visit   Subjective  Michael Griffith is a 40 y.o. male who presents for the following: Eczema (AD bil arms, 2wk f/u, Dupixent 300mg /54ml, mupirocin oint to open sores, no itching).  The following portions of the chart were reviewed this encounter and updated as appropriate:   Allergies  Meds  Problems  Med Hx  Surg Hx  Fam Hx     Review of Systems:  No other skin or systemic complaints except as noted in HPI or Assessment and Plan.  Objective  Well appearing patient in no apparent distress; mood and affect are within normal limits.  A focused examination was performed including arms, trunk. Relevant physical exam findings are noted in the Assessment and Plan.  Objective  bil arms: Pinkness scale crusts, erosions of arms and shoulders   Assessment & Plan  Atopic neurodermatitis with pruritus improved on systemic Dupixent injections.  He feels like the itching is much improved; nevertheless he continues to have significant crusts and linear excoriations on the arms with dyschromia. bil arms Atopic dermatitis - Severe, on Dupixent (biologic medication).  Atopic dermatitis (eczema) is a chronic, relapsing, pruritic condition that can significantly affect quality of life. It is often associated with allergic rhinitis and/or asthma and can require treatment with topical medications, phototherapy, or in severe cases a biologic medication called Dupixent.    Dupixent 300mg /9ml sq injection to L upper arm  Plan to start Adbry on f/u to see if this gives him better results than Dupixent.  dupilumab (DUPIXENT) prefilled syringe 300 mg - bil arms  Other Related Medications mometasone (ELOCON) 0.1 % cream tacrolimus (PROTOPIC) 0.1 % ointment Crisaborole (EUCRISA) 2 % OINT  Return in about 3 weeks (around 11/21/2020) for Atopic Derm.  I, Othelia Pulling, RMA, am acting as scribe for Sarina Ser, MD .  Documentation: I have reviewed the above documentation for accuracy and  completeness, and I agree with the above.  Sarina Ser, MD

## 2020-11-03 ENCOUNTER — Encounter: Payer: Self-pay | Admitting: Dermatology

## 2020-11-21 ENCOUNTER — Ambulatory Visit (INDEPENDENT_AMBULATORY_CARE_PROVIDER_SITE_OTHER): Payer: BC Managed Care – PPO | Admitting: Dermatology

## 2020-11-21 ENCOUNTER — Other Ambulatory Visit: Payer: Self-pay

## 2020-11-21 DIAGNOSIS — L2081 Atopic neurodermatitis: Secondary | ICD-10-CM

## 2020-11-21 MED ORDER — TRALOKINUMAB-LDRM 150 MG/ML ~~LOC~~ SOSY
600.0000 mg | PREFILLED_SYRINGE | Freq: Once | SUBCUTANEOUS | Status: AC
  Administered 2020-11-21: 600 mg via SUBCUTANEOUS

## 2020-11-21 NOTE — Progress Notes (Signed)
   Follow-Up Visit   Subjective  Michael Griffith is a 40 y.o. male who presents for the following: Eczema (Eczema  AD bil arms - 3 week follow up.Took last Dupixent injection 3 weeks ago. He is still getting sores on his arms).  The following portions of the chart were reviewed this encounter and updated as appropriate:   Allergies  Meds  Problems  Med Hx  Surg Hx  Fam Hx     Review of Systems:  No other skin or systemic complaints except as noted in HPI or Assessment and Plan.  Objective  Well appearing patient in no apparent distress; mood and affect are within normal limits.  A focused examination was performed including trunk, extremities. Relevant physical exam findings are noted in the Assessment and Plan.  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  Plan to try Adbry to see if can get better results. Arms Atopic neurodermatitis with pruritus.  He feels like the itching is much improved; nevertheless he continues to have significant crusts and linear excoriations on the arms with dyschromia.  Atopic dermatitis (eczema) is a chronic, relapsing, pruritic condition that can significantly affect quality of life. It is often associated with allergic rhinitis and/or asthma and can require treatment with topical medications, phototherapy, or in severe cases a biologic medication called Dupixent in older children and adults.   Starting Adbry injections today. Loading dose (600 mg) given today  Adbry 150 mg 2 injections to left upper arm and 2 injections to right upper arm.  Tralokinumab-ldrm SOSY 600 mg - Arms  Other Related Medications mometasone (ELOCON) 0.1 % cream tacrolimus (PROTOPIC) 0.1 % ointment Crisaborole (EUCRISA) 2 % OINT  Return in about 2 weeks (around 12/05/2020).   I, Ashok Cordia, CMA, am acting as scribe for  Sarina Ser, MD .  Documentation: I have reviewed the above documentation for accuracy and completeness, and I agree with the above.  Sarina Ser, MD

## 2020-11-25 ENCOUNTER — Encounter: Payer: Self-pay | Admitting: Dermatology

## 2020-12-05 ENCOUNTER — Ambulatory Visit (INDEPENDENT_AMBULATORY_CARE_PROVIDER_SITE_OTHER): Payer: BC Managed Care – PPO

## 2020-12-05 ENCOUNTER — Other Ambulatory Visit: Payer: Self-pay

## 2020-12-05 DIAGNOSIS — L2081 Atopic neurodermatitis: Secondary | ICD-10-CM

## 2020-12-05 MED ORDER — TRALOKINUMAB-LDRM 150 MG/ML ~~LOC~~ SOSY
300.0000 mg | PREFILLED_SYRINGE | Freq: Once | SUBCUTANEOUS | Status: AC
  Administered 2020-12-05: 300 mg via SUBCUTANEOUS

## 2020-12-05 NOTE — Progress Notes (Signed)
   Follow-Up Visit   Subjective  Michael Griffith is a 40 y.o. male who presents for the following: Dermatitis (Atopic dermatitis 2 week follow up - Adbry injections - had loading dose 11/21/2020 and he states he feels like his skin feels a little better and a little less itchy).    The following portions of the chart were reviewed this encounter and updated as appropriate:       Review of Systems:  No other skin or systemic complaints except as noted in HPI or Assessment and Plan.  Objective  Well appearing patient in no apparent distress; mood and affect are within normal limits.  A focused examination was performed including arms. Relevant physical exam findings are noted in the Assessment and Plan.  Objective  Arms: Pinkness scale crusts, erosions of arms and shoulders   Assessment & Plan  Atopic neurodermatitis Arms  Atopic neurodermatitis with pruritus.  He feels like the itching is much improved; nevertheless he continues to have significant crusts and linear excoriations on the arms with dyschromia.  Adbry 150 mg 1 injections to left upper arm and 1 injections to right upper arm.  Tralokinumab-ldrm SOSY 300 mg - Arms  Other Related Medications mometasone (ELOCON) 0.1 % cream tacrolimus (PROTOPIC) 0.1 % ointment Crisaborole (EUCRISA) 2 % OINT  Return in about 2 weeks (around 12/19/2020).

## 2020-12-19 ENCOUNTER — Other Ambulatory Visit: Payer: Self-pay

## 2020-12-19 ENCOUNTER — Ambulatory Visit (INDEPENDENT_AMBULATORY_CARE_PROVIDER_SITE_OTHER): Payer: BC Managed Care – PPO

## 2020-12-19 DIAGNOSIS — L209 Atopic dermatitis, unspecified: Secondary | ICD-10-CM

## 2020-12-19 MED ORDER — TRALOKINUMAB-LDRM 150 MG/ML ~~LOC~~ SOSY
300.0000 mg | PREFILLED_SYRINGE | Freq: Once | SUBCUTANEOUS | Status: AC
  Administered 2020-12-19: 300 mg via SUBCUTANEOUS

## 2020-12-19 NOTE — Progress Notes (Signed)
Patient here for two week Adbry injection.   Adbry 300mg /89mL injected into left upper arm. Patient tolerated well.   LOT: [id number] EXP:02/2022

## 2021-01-02 ENCOUNTER — Other Ambulatory Visit: Payer: Self-pay

## 2021-01-02 ENCOUNTER — Ambulatory Visit (INDEPENDENT_AMBULATORY_CARE_PROVIDER_SITE_OTHER): Payer: BC Managed Care – PPO | Admitting: Dermatology

## 2021-01-02 DIAGNOSIS — L280 Lichen simplex chronicus: Secondary | ICD-10-CM | POA: Diagnosis not present

## 2021-01-02 DIAGNOSIS — L299 Pruritus, unspecified: Secondary | ICD-10-CM | POA: Diagnosis not present

## 2021-01-02 DIAGNOSIS — L2089 Other atopic dermatitis: Secondary | ICD-10-CM | POA: Diagnosis not present

## 2021-01-02 MED ORDER — TRALOKINUMAB-LDRM 150 MG/ML ~~LOC~~ SOSY
300.0000 mg | PREFILLED_SYRINGE | Freq: Once | SUBCUTANEOUS | Status: AC
  Administered 2021-01-02: 300 mg via SUBCUTANEOUS

## 2021-01-02 MED ORDER — OPZELURA 1.5 % EX CREA: 1.0000 "application " | TOPICAL_CREAM | Freq: Two times a day (BID) | CUTANEOUS | 3 refills | Status: AC

## 2021-01-02 NOTE — Progress Notes (Signed)
   Follow-Up Visit   Subjective  Michael Griffith is a 40 y.o. male who presents for the following: Follow-up (Atopic Dermatitis follow up - Seems to be slowly improving with Adbry injections).  His itch is definitely improved.  The following portions of the chart were reviewed this encounter and updated as appropriate:   Allergies  Meds  Problems  Med Hx  Surg Hx  Fam Hx     Review of Systems:  No other skin or systemic complaints except as noted in HPI or Assessment and Plan.  Objective  Well appearing patient in no apparent distress; mood and affect are within normal limits.  A focused examination was performed including arms. Relevant physical exam findings are noted in the Assessment and Plan.  Objective  Arms: Pinkness scale crusts, erosions of arms and shoulders   Assessment & Plan  Severe Atopic dermatitis with Neurodermatitis and itch and excoriations Arms Atopic neurodermatitis with pruritus.  He feels like the itching is much improved; nevertheless he continues to have significant crusts and linear excoriations on the arms with dyschromia.   Atopic dermatitis (eczema) is a chronic, relapsing, pruritic condition that can significantly affect quality of life. It is often associated with allergic rhinitis and/or asthma and can require treatment with topical medications, phototherapy, or in severe cases a biologic medication called Dupixent or Adbry in older children and adults.   Adbry injections today. Adbry 150 mg 2 injections to right upper arm today.  Start Opzelura cream bid to arms.  Ruxolitinib Phosphate (OPZELURA) 1.5 % CREA - Arms  Tralokinumab-ldrm SOSY 300 mg - Arms  Other Related Medications triamcinolone cream (KENALOG) 0.1 %  Return in about 2 weeks (around 01/16/2021) for on nurse schedule for injection then 1 month with Dr Nehemiah Massed.  I, Ashok Cordia, CMA, am acting as scribe for Sarina Ser, MD .  Documentation: I have reviewed the above  documentation for accuracy and completeness, and I agree with the above.  Sarina Ser, MD

## 2021-01-02 NOTE — Patient Instructions (Signed)

## 2021-01-07 ENCOUNTER — Encounter: Payer: Self-pay | Admitting: Dermatology

## 2021-01-13 ENCOUNTER — Other Ambulatory Visit: Payer: Self-pay

## 2021-01-13 MED ORDER — ADBRY 150 MG/ML ~~LOC~~ SOSY: 300.0000 mg | PREFILLED_SYRINGE | SUBCUTANEOUS | 5 refills | Status: AC

## 2021-01-13 NOTE — Progress Notes (Signed)
Adbry injections were delivered last Friday. Called Fran[facility] Poot to discuss matters. RX will be re sent out this week and note made on account we are closed Friday's. New RX sent in so patient's prescription remains active through patient assistance.

## 2021-01-16 ENCOUNTER — Ambulatory Visit (INDEPENDENT_AMBULATORY_CARE_PROVIDER_SITE_OTHER): Payer: BC Managed Care – PPO

## 2021-01-16 ENCOUNTER — Other Ambulatory Visit: Payer: Self-pay

## 2021-01-16 DIAGNOSIS — L209 Atopic dermatitis, unspecified: Secondary | ICD-10-CM

## 2021-01-16 MED ORDER — TRALOKINUMAB-LDRM 150 MG/ML ~~LOC~~ SOSY
300.0000 mg | PREFILLED_SYRINGE | Freq: Once | SUBCUTANEOUS | Status: AC
  Administered 2021-01-16: 300 mg via SUBCUTANEOUS

## 2021-01-16 NOTE — Progress Notes (Signed)
Patient here for two week Adbry injections.  Adbry 150mg /mL X2 syringes injected into patients left upper arm. Patient tolerated well.  LOT: [id number]  EXP: 02/2022

## 2021-01-30 ENCOUNTER — Ambulatory Visit (INDEPENDENT_AMBULATORY_CARE_PROVIDER_SITE_OTHER): Payer: BC Managed Care – PPO | Admitting: Dermatology

## 2021-01-30 ENCOUNTER — Other Ambulatory Visit: Payer: Self-pay

## 2021-01-30 DIAGNOSIS — L2081 Atopic neurodermatitis: Secondary | ICD-10-CM | POA: Diagnosis not present

## 2021-01-30 MED ORDER — TRALOKINUMAB-LDRM 150 MG/ML ~~LOC~~ SOSY
300.0000 mg | PREFILLED_SYRINGE | Freq: Once | SUBCUTANEOUS | Status: AC
  Administered 2021-01-30: 300 mg via SUBCUTANEOUS

## 2021-01-30 NOTE — Progress Notes (Signed)
   Follow-Up Visit   Subjective  Michael Griffith is a 40 y.o. male who presents for the following: atopic neurodermatitis (Patient has noticed an improvement since switching from [place] to [place]. He is using Opzelura QD PRN, but hasn't needed to use other topicals).  The following portions of the chart were reviewed this encounter and updated as appropriate:   Allergies  Meds  Problems  Med Hx  Surg Hx  Fam Hx     Review of Systems:  No other skin or systemic complaints except as noted in HPI or Assessment and Plan.  Objective  Well appearing patient in no apparent distress; mood and affect are within normal limits.  A focused examination was performed including the face and arms. Relevant physical exam findings are noted in the Assessment and Plan.  Objective  Trunk, extremities: 12 small excoriations on the forearms.   Assessment & Plan  Atopic neurodermatitis Trunk, extremities Severe Atopic dermatitis with Neurodermatitis with itch and excoriations -improved more so after switching from [place] to [place], but still persistent with significant itch and not to goal.  Atopic dermatitis (eczema) is a chronic, relapsing, pruritic condition that can significantly affect quality of life. It is often associated with allergic rhinitis and/or asthma and can require treatment with topical medications, phototherapy, or in severe cases a biologic medication called Dupixent in older children and adults.   Adbry 150mg /mL injected SQ into the R upper arm x 2. Patient tolerated injections well. AL, CMA   Consider Rinvoq or Cibinqo if not improving at follow up appointment.   Tralokinumab-ldrm SOSY 300 mg - Trunk, extremities  Other Related Medications mometasone (ELOCON) 0.1 % cream tacrolimus (PROTOPIC) 0.1 % ointment Crisaborole (EUCRISA) 2 % OINT  Return in about 2 weeks (around 02/13/2021) for nurse visit - Adbry injection; 4-8 weeks follow up with Dr. Nehemiah Griffith.  Michael Griffith,  CMA, am acting as scribe for Michael Ser, MD .  Documentation: I have reviewed the above documentation for accuracy and completeness, and I agree with the above.  Michael Ser, MD

## 2021-01-30 NOTE — Patient Instructions (Signed)

## 2021-02-05 ENCOUNTER — Encounter: Payer: Self-pay | Admitting: Dermatology

## 2021-02-13 ENCOUNTER — Other Ambulatory Visit: Payer: Self-pay

## 2021-02-13 ENCOUNTER — Ambulatory Visit (INDEPENDENT_AMBULATORY_CARE_PROVIDER_SITE_OTHER): Payer: BC Managed Care – PPO

## 2021-02-13 DIAGNOSIS — L209 Atopic dermatitis, unspecified: Secondary | ICD-10-CM | POA: Diagnosis not present

## 2021-02-13 MED ORDER — TRALOKINUMAB-LDRM 150 MG/ML ~~LOC~~ SOSY
300.0000 mg | PREFILLED_SYRINGE | Freq: Once | SUBCUTANEOUS | Status: AC
  Administered 2021-02-13: 300 mg via SUBCUTANEOUS

## 2021-02-13 NOTE — Progress Notes (Signed)
Patient here today for two week injection of Adbry.  Adbry 300mg /45mL injected into left upper arm. Patient tolerated well.   LOT: [id number] EXP: 02/2022

## 2021-02-27 ENCOUNTER — Ambulatory Visit (INDEPENDENT_AMBULATORY_CARE_PROVIDER_SITE_OTHER): Payer: BC Managed Care – PPO

## 2021-02-27 ENCOUNTER — Other Ambulatory Visit: Payer: Self-pay

## 2021-02-27 DIAGNOSIS — L209 Atopic dermatitis, unspecified: Secondary | ICD-10-CM

## 2021-02-27 MED ORDER — TRALOKINUMAB-LDRM 150 MG/ML ~~LOC~~ SOSY
300.0000 mg | PREFILLED_SYRINGE | Freq: Once | SUBCUTANEOUS | Status: AC
  Administered 2021-02-27: 300 mg via SUBCUTANEOUS

## 2021-02-27 NOTE — Progress Notes (Signed)
Patient here for two week Adbry injections.  Adbry 300mg  injected into right upper arm. Patient tolerated well.  LOT: [id number] EXP:05/2022

## 2021-03-19 ENCOUNTER — Other Ambulatory Visit: Payer: Self-pay

## 2021-03-19 ENCOUNTER — Ambulatory Visit (INDEPENDENT_AMBULATORY_CARE_PROVIDER_SITE_OTHER): Payer: BC Managed Care – PPO | Admitting: Dermatology

## 2021-03-19 DIAGNOSIS — L2081 Atopic neurodermatitis: Secondary | ICD-10-CM

## 2021-03-19 MED ORDER — TRALOKINUMAB-LDRM 150 MG/ML ~~LOC~~ SOSY
300.0000 mg | PREFILLED_SYRINGE | Freq: Once | SUBCUTANEOUS | Status: AC
  Administered 2021-03-19: 300 mg via SUBCUTANEOUS

## 2021-03-19 NOTE — Patient Instructions (Signed)

## 2021-03-19 NOTE — Progress Notes (Signed)
   Follow-Up Visit   Subjective  Michael Griffith is a 40 y.o. male who presents for the following: Dermatitis (Atopic neurodermatitis follow up of arms - Has been on Adbry x ~4 months and has had a little bit of improvement but is still getting new spots. He would like to consider trying the oral medications.).  The following portions of the chart were reviewed this encounter and updated as appropriate:   Allergies  Meds  Problems  Med Hx  Surg Hx  Fam Hx     Review of Systems:  No other skin or systemic complaints except as noted in HPI or Assessment and Plan.  Objective  Well appearing patient in no apparent distress; mood and affect are within normal limits.  A focused examination was performed including arms. Relevant physical exam findings are noted in the Assessment and Plan.  Arms Small excoriations on the forearms.     Assessment & Plan  Atopic neurodermatitis Arms  Severe Atopic dermatitis with Neurodermatitis with itch and excoriations -improved more so after switching from [place] to [place], but still persistent with significant itch and not to goal.   Atopic dermatitis (eczema) is a chronic, relapsing, pruritic condition that can significantly affect quality of life. It is often associated with allergic rhinitis and/or asthma and can require treatment with topical medications, phototherapy, or in severe cases a biologic medication called Dupixent in older children and adults.   Adbry 150mg /mL injected SQ into the left upper arm x 2. Patient tolerated injections well.    Discussed Rinvoq vs Cibinqo. Information given about Rinvoq. Patient would like to read about it before considering changing. Order given for labs and patient advised if he decides to change his treatment to Rinvoq, have labs drawn and once we have the results, we can start the approval process for Rinvoq. He may continue Adbry until he decides about oral treatment.  Comprehensive metabolic panel -  Arms  CBC with Differential/Platelet - Arms  Hepatitis B surface antibody,qualitative - Arms  Hepatitis B surface antigen - Arms  Hepatitis C antibody - Arms  HIV Antibody (routine testing w rflx) - Arms  QuantiFERON-TB Gold Plus - Arms  Tralokinumab-ldrm SOSY 300 mg - Arms   Related Medications mometasone (ELOCON) 0.1 % cream Apply 1 application topically daily. Qd to aa rash on arms until clear, then prn flares  tacrolimus (PROTOPIC) 0.1 % ointment Apply topically daily. Qd to aa rash on arms  Crisaborole (EUCRISA) 2 % OINT Apply 1 application topically as directed. Qd to bid aa eczema on arms, body  Return in about 2 weeks (around 04/02/2021) for Injection on Nurse schedule, 2 months with Dr. Nehemiah Massed.  I, Ashok Cordia, CMA, am acting as scribe for Sarina Ser, MD .  Documentation: I have reviewed the above documentation for accuracy and completeness, and I agree with the above.  Sarina Ser, MD

## 2021-03-29 ENCOUNTER — Encounter: Payer: Self-pay | Admitting: Dermatology

## 2021-04-02 ENCOUNTER — Ambulatory Visit (INDEPENDENT_AMBULATORY_CARE_PROVIDER_SITE_OTHER): Payer: BC Managed Care – PPO

## 2021-04-02 ENCOUNTER — Other Ambulatory Visit: Payer: Self-pay

## 2021-04-02 DIAGNOSIS — L209 Atopic dermatitis, unspecified: Secondary | ICD-10-CM

## 2021-04-02 MED ORDER — TRALOKINUMAB-LDRM 150 MG/ML ~~LOC~~ SOSY
300.0000 mg | PREFILLED_SYRINGE | Freq: Once | SUBCUTANEOUS | Status: AC
  Administered 2021-04-02: 300 mg via SUBCUTANEOUS

## 2021-04-02 NOTE — Progress Notes (Signed)
Patient here for two week Adbry injections.   Adbry 300mg  injected into right upper arm. Patient tolerated well.  LOT: [id number] EXP: 02/2022

## 2021-04-17 ENCOUNTER — Other Ambulatory Visit: Payer: Self-pay

## 2021-04-17 ENCOUNTER — Ambulatory Visit (INDEPENDENT_AMBULATORY_CARE_PROVIDER_SITE_OTHER): Payer: BC Managed Care – PPO | Admitting: Dermatology

## 2021-04-17 DIAGNOSIS — L2081 Atopic neurodermatitis: Secondary | ICD-10-CM | POA: Diagnosis not present

## 2021-04-17 MED ORDER — TRALOKINUMAB-LDRM 150 MG/ML ~~LOC~~ SOSY
300.0000 mg | PREFILLED_SYRINGE | Freq: Once | SUBCUTANEOUS | Status: AC
  Administered 2021-04-17: 300 mg via SUBCUTANEOUS

## 2021-04-17 NOTE — Patient Instructions (Signed)

## 2021-04-17 NOTE — Progress Notes (Deleted)
   Follow-Up Visit   Subjective  Michael Griffith is a 40 y.o. male who presents for the following: atopic neurodermatitis (Patient currently on [place] '300mg'$ /mL and is still not as controlled as he would like to be. He is interested in discussing Rinvoq. ).   The following portions of the chart were reviewed this encounter and updated as appropriate:       Review of Systems:  No other skin or systemic complaints except as noted in HPI or Assessment and Plan.  Objective  Well appearing patient in no apparent distress; mood and affect are within normal limits.  A focused examination was performed including the extremities. Relevant physical exam findings are noted in the Assessment and Plan.    Assessment & Plan  Atopic neurodermatitis Trunk, extremities  Atopic dermatitis (eczema) is a chronic, relapsing, pruritic condition that can significantly affect quality of life. It is often associated with allergic rhinitis and/or asthma and can require treatment with topical medications, phototherapy, or in severe cases a biologic medication called Dupixent in children and adults.   Related Medications mometasone (ELOCON) 0.1 % cream Apply 1 application topically daily. Qd to aa rash on arms until clear, then prn flares  tacrolimus (PROTOPIC) 0.1 % ointment Apply topically daily. Qd to aa rash on arms  Crisaborole (EUCRISA) 2 % OINT Apply 1 application topically as directed. Qd to bid aa eczema on arms, body   No follow-ups on file.  [name], CMA, am acting as scribe for Sarina Ser, MD .

## 2021-04-17 NOTE — Progress Notes (Signed)
   Follow-Up Visit   Subjective  Michael Griffith is a 40 y.o. male who presents for the following: atopic neurodermatitis (Patient currently on Adbry '300mg'$ /mL and is still not as controlled as he would like to be. He is interested in discussing Rinvoq. ).  The following portions of the chart were reviewed this encounter and updated as appropriate:   Allergies  Meds  Problems  Med Hx  Surg Hx  Fam Hx     Review of Systems:  No other skin or systemic complaints except as noted in HPI or Assessment and Plan.  Objective  Well appearing patient in no apparent distress; mood and affect are within normal limits.  A focused examination was performed including the extremities. Relevant physical exam findings are noted in the Assessment and Plan.   Assessment & Plan  Atopic neurodermatitis -improved but not to goal on Adbry injections. The patient would like to consider treatment with Rinvoq Trunk, extremities  Atopic dermatitis (eczema) is a chronic, relapsing, pruritic condition that can significantly affect quality of life. It is often associated with allergic rhinitis and/or asthma and can require treatment with topical medications, phototherapy, or in severe cases a biologic medication called Dupixent in children and adults.   Patient is interested in starting Rinvoq. Pending labs will plan to supply him with samples. Pamphlet given on Rinvoq.   Tralokinumab-ldrm SOSY 300 mg - Trunk, extremities  Related Medications mometasone (ELOCON) 0.1 % cream Apply 1 application topically daily. Qd to aa rash on arms until clear, then prn flares  tacrolimus (PROTOPIC) 0.1 % ointment Apply topically daily. Qd to aa rash on arms  Crisaborole (EUCRISA) 2 % OINT Apply 1 application topically as directed. Qd to bid aa eczema on arms, body  Return in about 2 weeks (around 05/01/2021) for adbury injection vs starting Rinvoq.  [name], CMA, am acting as scribe for Sarina Ser, MD  . Documentation: I have reviewed the above documentation for accuracy and completeness, and I agree with the above.  Sarina Ser, MD

## 2021-04-22 ENCOUNTER — Encounter: Payer: Self-pay | Admitting: Dermatology

## 2021-04-30 ENCOUNTER — Ambulatory Visit: Payer: BC Managed Care – PPO | Admitting: Dermatology
